# Patient Record
Sex: Male | Born: 1970 | Race: White | Hispanic: No | Marital: Married | Smoking: Former smoker
Health system: Southern US, Community
[De-identification: ages and names within clinical notes are randomized; demographics above are authoritative.]

## PROBLEM LIST (undated history)

## (undated) DIAGNOSIS — E785 Hyperlipidemia, unspecified: Secondary | ICD-10-CM

## (undated) DIAGNOSIS — K219 Gastro-esophageal reflux disease without esophagitis: Secondary | ICD-10-CM

## (undated) HISTORY — PX: UPPER GI ENDOSCOPY: SHX6162

---

## 2012-07-05 ENCOUNTER — Emergency Department (INDEPENDENT_AMBULATORY_CARE_PROVIDER_SITE_OTHER): Payer: BC Managed Care – PPO

## 2012-07-05 ENCOUNTER — Encounter: Payer: Self-pay | Admitting: *Deleted

## 2012-07-05 ENCOUNTER — Emergency Department
Admission: EM | Admit: 2012-07-05 | Discharge: 2012-07-05 | Disposition: A | Discharge status: Home / Self Care | Payer: BC Managed Care – PPO | Source: Home / Self Care | Attending: Family Medicine | Admitting: Family Medicine

## 2012-07-05 DIAGNOSIS — M79609 Pain in unspecified limb: Secondary | ICD-10-CM

## 2012-07-05 DIAGNOSIS — M7989 Other specified soft tissue disorders: Secondary | ICD-10-CM

## 2012-07-05 DIAGNOSIS — W219XXA Striking against or struck by unspecified sports equipment, initial encounter: Secondary | ICD-10-CM

## 2012-07-05 DIAGNOSIS — S63619A Unspecified sprain of unspecified finger, initial encounter: Secondary | ICD-10-CM

## 2012-07-05 DIAGNOSIS — Y9367 Activity, basketball: Secondary | ICD-10-CM

## 2012-07-05 DIAGNOSIS — S6390XA Sprain of unspecified part of unspecified wrist and hand, initial encounter: Secondary | ICD-10-CM

## 2012-07-05 HISTORY — DX: Hyperlipidemia, unspecified: E78.5

## 2012-07-05 HISTORY — DX: Gastro-esophageal reflux disease without esophagitis: K21.9

## 2012-07-05 NOTE — ED Notes (Signed)
Pt c/o RT 5th digit injury x last night while playing basketball. He has taken IBF with some relief.

## 2012-07-05 NOTE — ED Provider Notes (Signed)
History     CSN: [id number]  Arrival date & time 07/05/12  1030   First MD Initiated Contact with Patient 07/05/12 1056      Chief Complaint  Patient presents with  . Finger Injury       HPI Comments: Pt complains RT 5th digit injury x last night while playing basketball. He has taken IBF with some relief.  Patient is a 42 y.o. male presenting with hand pain. The history is provided by the patient.  Hand Pain This is a new problem. The current episode started yesterday. The problem occurs constantly. The problem has not changed since onset.Associated symptoms comments: none. Exacerbated by: flexing finger. The symptoms are relieved by ice. Treatments tried: ice pack. The treatment provided mild relief.    Past Medical History  Diagnosis Date  . GERD (gastroesophageal reflux disease)   . Hyperlipemia     History reviewed. No pertinent past surgical history.  Family History  Problem Relation Age of Onset  . Hyperlipidemia Mother   . Hypertension Mother   . Hyperlipidemia Father     History  Substance Use Topics  . Smoking status: Former Games developer  . Smokeless tobacco: Not on file  . Alcohol Use: Yes      Review of Systems  All other systems reviewed and are negative.    Allergies  Review of patient's allergies indicates no known allergies.  Home Medications   Current Outpatient Rx  Name  Route  Sig  Dispense  Refill  . omeprazole (PRILOSEC) 20 MG capsule   Oral   Take 20 mg by mouth daily.           BP 142/89  Pulse 60  Temp(Src) 98.1 F (36.7 C) (Oral)  Ht 5\' 10"  (1.778 m)  Wt 173 lb (78.472 kg)  BMI 24.82 kg/m2  SpO2 100%  Physical Exam  Nursing note and vitals reviewed. Constitutional: He is oriented to person, place, and time. He appears well-developed and well-nourished. No distress.  HENT:  Head: Atraumatic.  Eyes: Conjunctivae are normal. Pupils are equal, round, and reactive to light.  Musculoskeletal:       Right hand: He exhibits  decreased range of motion, tenderness, bony tenderness and swelling. He exhibits normal two-point discrimination, normal capillary refill, no deformity and no laceration. Normal sensation noted. Normal strength noted.       Hands: Right 5th finger has tenderness over the PIP joint and middle phalanx.  Slight decreased flexion at PIP joint.  Distal neurovascular function is intact.   Neurological: He is alert and oriented to person, place, and time.  Skin: Skin is warm and dry. No erythema.    ED Course  Procedures  none  Labs Reviewed - No data to display Dg Finger Little Right  07/05/2012  *RADIOLOGY REPORT*  Clinical Data: Injured finger playing basketball with pain and swelling  RIGHT LITTLE FINGER 2+V  Comparison: None.  Findings: No acute fracture is seen.  Alignment is normal.  There is soft tissue swelling around the right fifth PIP joint.  IMPRESSION: No acute fracture.   Original Report Authenticated By: Dwyane Dee, M.D.      1. Finger sprain, initial encounter       MDM  Buddy taped fingers. Buddy tape fingers for about 5 days.  Continue applying ice pack several times daily until swelling decreases.  May take Ibuprofen 200mg , 4 tabs every 8 hours with food.  Begin finger exercises in about 3 to 5 days [facility]  information and instruction handout given)  Followup with [facility] if not improving about two weeks.         Lattie Haw, MD 07/09/12 [phone]

## 2013-04-04 ENCOUNTER — Encounter: Payer: Self-pay | Admitting: Emergency Medicine

## 2013-04-04 ENCOUNTER — Emergency Department
Admission: EM | Admit: 2013-04-04 | Discharge: 2013-04-04 | Disposition: A | Discharge status: Home / Self Care | Payer: BC Managed Care – PPO | Source: Home / Self Care | Attending: Family Medicine | Admitting: Family Medicine

## 2013-04-04 ENCOUNTER — Encounter (HOSPITAL_BASED_OUTPATIENT_CLINIC_OR_DEPARTMENT_OTHER): Payer: Self-pay | Admitting: Emergency Medicine

## 2013-04-04 ENCOUNTER — Emergency Department (HOSPITAL_BASED_OUTPATIENT_CLINIC_OR_DEPARTMENT_OTHER)
Admission: EM | Admit: 2013-04-04 | Discharge: 2013-04-04 | Disposition: A | Discharge status: Home / Self Care | Payer: BC Managed Care – PPO | Attending: Emergency Medicine | Admitting: Emergency Medicine

## 2013-04-04 DIAGNOSIS — Z87891 Personal history of nicotine dependence: Secondary | ICD-10-CM | POA: Insufficient documentation

## 2013-04-04 DIAGNOSIS — Z9889 Other specified postprocedural states: Secondary | ICD-10-CM | POA: Insufficient documentation

## 2013-04-04 DIAGNOSIS — Z79899 Other long term (current) drug therapy: Secondary | ICD-10-CM | POA: Insufficient documentation

## 2013-04-04 DIAGNOSIS — K219 Gastro-esophageal reflux disease without esophagitis: Secondary | ICD-10-CM | POA: Insufficient documentation

## 2013-04-04 DIAGNOSIS — Z8639 Personal history of other endocrine, nutritional and metabolic disease: Secondary | ICD-10-CM | POA: Insufficient documentation

## 2013-04-04 DIAGNOSIS — Z862 Personal history of diseases of the blood and blood-forming organs and certain disorders involving the immune mechanism: Secondary | ICD-10-CM | POA: Insufficient documentation

## 2013-04-04 DIAGNOSIS — R110 Nausea: Secondary | ICD-10-CM

## 2013-04-04 LAB — CBC WITH DIFFERENTIAL/PLATELET
BASOS ABS: 0 10*3/uL (ref 0.0–0.1)
BASOS PCT: 0 % (ref 0–1)
EOS ABS: 0 10*3/uL (ref 0.0–0.7)
EOS PCT: 0 % (ref 0–5)
HEMATOCRIT: 42 % (ref 39.0–52.0)
HEMOGLOBIN: 14.2 g/dL (ref 13.0–17.0)
Lymphocytes Relative: 22 % (ref 12–46)
Lymphs Abs: 1.7 10*3/uL (ref 0.7–4.0)
MCH: 29.1 pg (ref 26.0–34.0)
MCHC: 33.8 g/dL (ref 30.0–36.0)
MCV: 86.1 fL (ref 78.0–100.0)
MONO ABS: 0.9 10*3/uL (ref 0.1–1.0)
MONOS PCT: 12 % (ref 3–12)
Neutro Abs: 5.1 10*3/uL (ref 1.7–7.7)
Neutrophils Relative %: 66 % (ref 43–77)
Platelets: 264 10*3/uL (ref 150–400)
RBC: 4.88 MIL/uL (ref 4.22–5.81)
RDW: 13.2 % (ref 11.5–15.5)
WBC: 7.7 10*3/uL (ref 4.0–10.5)

## 2013-04-04 LAB — URINALYSIS, ROUTINE W REFLEX MICROSCOPIC
BILIRUBIN URINE: NEGATIVE
GLUCOSE, UA: NEGATIVE mg/dL
KETONES UR: NEGATIVE mg/dL
Leukocytes, UA: NEGATIVE
Nitrite: NEGATIVE
PH: 6 (ref 5.0–8.0)
PROTEIN: NEGATIVE mg/dL
Specific Gravity, Urine: 1.023 (ref 1.005–1.030)
Urobilinogen, UA: 0.2 mg/dL (ref 0.0–1.0)

## 2013-04-04 LAB — COMPREHENSIVE METABOLIC PANEL
ALBUMIN: 4.6 g/dL (ref 3.5–5.2)
ALT: 28 U/L (ref 0–53)
AST: 27 U/L (ref 0–37)
Alkaline Phosphatase: 61 U/L (ref 39–117)
BILIRUBIN TOTAL: 0.7 mg/dL (ref 0.3–1.2)
BUN: 15 mg/dL (ref 6–23)
CALCIUM: 10 mg/dL (ref 8.4–10.5)
CHLORIDE: 99 meq/L (ref 96–112)
CO2: 27 mEq/L (ref 19–32)
CREATININE: 1.1 mg/dL (ref 0.50–1.35)
GFR calc Af Amer: >90 mL/min (ref >90)
GFR calc non Af Amer: 82 mL/min — ABNORMAL LOW (ref >90)
Glucose, Bld: 106 mg/dL — ABNORMAL HIGH (ref 70–99)
Potassium: 4.1 mEq/L (ref 3.7–5.3)
Sodium: 140 mEq/L (ref 137–147)
TOTAL PROTEIN: 8.6 g/dL — AB (ref 6.0–8.3)

## 2013-04-04 LAB — URINE MICROSCOPIC-ADD ON

## 2013-04-04 LAB — LIPASE, BLOOD: LIPASE: 49 U/L (ref 11–59)

## 2013-04-04 MED ORDER — PROMETHAZINE HCL 25 MG PO TABS: 25.0000 mg | ORAL_TABLET | Freq: Four times a day (QID) | ORAL | Status: AC | PRN

## 2013-04-04 NOTE — ED Notes (Signed)
Pt amb to room 10 with quick steady gait in nad. Pt smiling, reports nausea, one episode daily, x Monday. Pt states he had trouble sleeping last night due to nausea. Denies any emesis or diarrhea.

## 2013-04-04 NOTE — ED Provider Notes (Signed)
CSN: [id number]     Arrival date & time 04/04/13  0907 History   First MD Initiated Contact with Patient 04/04/13 0915     Chief Complaint  Patient presents with  . Nausea  . Abdominal Pain   HPI  Past Medical History  Diagnosis Date  . GERD (gastroesophageal reflux disease)   . Hyperlipemia    Past Surgical History  Procedure Laterality Date  . Upper gi endoscopy     Family History  Problem Relation Age of Onset  . Hyperlipidemia Mother   . Hypertension Mother   . Hyperlipidemia Father    History  Substance Use Topics  . Smoking status: Former Games developermoker  . Smokeless tobacco: Current User  . Alcohol Use: Yes    Review of Systems  Allergies  Review of patient's allergies indicates no known allergies.  Home Medications   BP 163/107  Pulse 75  Temp(Src) 98.3 F (36.8 C) (Oral)  Resp 14  Wt 170 lb (77.111 kg)  SpO2 100% Physical Exam  ED Course  Procedures (including critical care time)   MDM  Pt initially seen with chief complaint of progressive abdominal pain over the last 4-5 days.  Ate some pork tacos 5 days ago.  Has had persistent intermittent abd pain since this point with assd nausea.  No fever, chills.  No diarrhea.  Baseline reflux. Pain sometimes assd after eating.  No am hoarseness, dysphagia.  Pain kept him up all night.  Pain fairly sharp without radiation.  Pain 8+/10 at its worse.  Also pending workup for microscopic hematuria.  No dysuria today.  Because of logistical issues, [facility] as pt may benefit from CT imaging (unavailable here today on holiday).  Discussed case with [facility] EDP.      Doree AlbeeSteven Mykayla Brinton, MD 04/04/13 [phone]

## 2013-04-04 NOTE — ED Provider Notes (Signed)
CSN: [id number]     Arrival date & time 04/04/13  0865 History   First MD Initiated Contact with Patient 04/04/13 1036     Chief Complaint  Patient presents with  . Nausea   (Consider location/radiation/quality/duration/timing/severity/associated sxs/prior Treatment) HPI Comments: Patient is a 43 year old male with history of esophageal reflux. He presents today with complaints of nausea that has been present for the past 6 days. He states he feels a mild "burning" throughout his abdomen but denies any specific pain. He denies having any vomiting or diarrhea and states that his stools have been normal. His concerns are not the severity of his symptoms but the actual duration of the nausea he's been experiencing. He denies any fevers or chills. He denies any urinary complaints. He has had an endoscopy in the past and also tells me he is due for a cystoscopy due to microscopic hematuria that has been found on his urinalysis.  He was seen this morning in urgent care and [facility] and sent here for further evaluation and possible imaging studies.  The history is provided by the patient.    Past Medical History  Diagnosis Date  . GERD (gastroesophageal reflux disease)   . Hyperlipemia    Past Surgical History  Procedure Laterality Date  . Upper gi endoscopy     Family History  Problem Relation Age of Onset  . Hyperlipidemia Mother   . Hypertension Mother   . Hyperlipidemia Father    History  Substance Use Topics  . Smoking status: Former Games developer  . Smokeless tobacco: Current User  . Alcohol Use: Yes    Review of Systems  All other systems reviewed and are negative.    Allergies  Review of patient's allergies indicates no known allergies.  Home Medications   Current Outpatient Rx  Name  Route  Sig  Dispense  Refill  . omeprazole (PRILOSEC) 20 MG capsule   Oral   Take 20 mg by mouth daily.          BP 175/117  Pulse 92  Temp(Src) 98.1 F (36.7 C) (Oral)  Resp 18   Ht 5\' 9"  (1.753 m)  Wt 170 lb (77.111 kg)  BMI 25.09 kg/m2  SpO2 100% Physical Exam  Nursing note and vitals reviewed. Constitutional: He is oriented to person, place, and time. He appears well-developed and well-nourished. No distress.  HENT:  Head: Normocephalic and atraumatic.  Mouth/Throat: Oropharynx is clear and moist.  Neck: Normal range of motion. Neck supple.  Cardiovascular: Normal rate, regular rhythm and normal heart sounds.   No murmur heard. Pulmonary/Chest: Effort normal and breath sounds normal. No respiratory distress. He has no wheezes.  Abdominal: Soft. Bowel sounds are normal. He exhibits no distension and no mass. There is no tenderness. There is no rebound and no guarding.  Musculoskeletal: Normal range of motion. He exhibits no edema.  Lymphadenopathy:    He has no cervical adenopathy.  Neurological: He is alert and oriented to person, place, and time.  Skin: Skin is warm and dry. He is not diaphoretic.    ED Course  Procedures (including critical care time) Labs Review Labs Reviewed - No data to display Imaging Review No results found.  EKG Interpretation   None       MDM  No diagnosis found. Patient is a 43 year old male presents with complaints of nausea that has been ongoing for the past 6 days. He is not concerned about the severity of his illness, more so the duration. He  is not having any abdominal pain, diarrhea, or fever. He is also not having any vomiting. He was seen in urgent care and sent here for further evaluation. Blood work reveals no elevation of white count normal liver functions and lipase. His urine shows microscopic hematuria which is known and is in the process of being worked up. I doubt this is related to today's presentation. I suspect this is a viral illness that will take more time to run its course. I discussed further imaging with the patient as that is why he was sent here. I do not feel as though a CT is warranted as there  is no tenderness, it is not complaining of pain, and his abdomen is completely benign. His laboratory studies are also unremarkable and I feel as though the chances of an abnormality on his CT scan would be slim. We have decided to go with treating the nausea and giving this 2 more days to sort itself out. If his symptoms worsen or do not resolve in that period of time he is to return to discuss a CT scan then.    Andre Lyonsouglas Morris Markham, MD 04/04/13 1200

## 2013-04-04 NOTE — ED Notes (Signed)
Elige Andre Williams reports onset of intermittent nausea and abdominal "burning" x 6 days. This started after having a meal of pork tacos [place] [employer] [place]. Denies vomiting or diarrhea, appetite decreased. Endo done 2 years ago, clear.

## 2013-04-04 NOTE — Discharge Instructions (Signed)
Phenergan as needed for nausea.  Return to the emergency department if not improving in the next 48 hours, sooner if your symptoms worsen or change.   Nausea, Adult Nausea is the feeling that you have an upset stomach or have to vomit. Nausea by itself is not likely a serious concern, but it may be an early sign of more serious medical problems. As nausea gets worse, it can lead to vomiting. If vomiting develops, there is the risk of dehydration.  CAUSES   Viral infections.  Food poisoning.  Medicines.  Pregnancy.  Motion sickness.  Migraine headaches.  Emotional distress.  Severe pain from any source.  Alcohol intoxication. HOME CARE INSTRUCTIONS  Get plenty of rest.  Ask your caregiver about specific rehydration instructions.  Eat small amounts of food and sip liquids more often.  Take all medicines as told by your caregiver. SEEK MEDICAL CARE IF:  You have not improved after 2 days, or you get worse.  You have a headache. SEEK IMMEDIATE MEDICAL CARE IF:   You have a fever.  You faint.  You keep vomiting or have blood in your vomit.  You are extremely weak or dehydrated.  You have dark or bloody stools.  You have severe chest or abdominal pain. MAKE SURE YOU:  Understand these instructions.  Will watch your condition.  Will get help right away if you are not doing well or get worse. Document Released: 04/28/2004 Document Revised: 12/14/2011 Document Reviewed: 12/01/2010 [facility] Patient Information 2014 [place] [place].

## 2013-12-05 ENCOUNTER — Emergency Department
Admission: EM | Admit: 2013-12-05 | Discharge: 2013-12-05 | Disposition: A | Discharge status: Home / Self Care | Payer: BC Managed Care – PPO | Source: Home / Self Care | Attending: Emergency Medicine | Admitting: Emergency Medicine

## 2013-12-05 ENCOUNTER — Emergency Department (INDEPENDENT_AMBULATORY_CARE_PROVIDER_SITE_OTHER): Payer: BC Managed Care – PPO

## 2013-12-05 ENCOUNTER — Encounter: Payer: Self-pay | Admitting: Emergency Medicine

## 2013-12-05 DIAGNOSIS — S62639A Displaced fracture of distal phalanx of unspecified finger, initial encounter for closed fracture: Secondary | ICD-10-CM

## 2013-12-05 DIAGNOSIS — S62634A Displaced fracture of distal phalanx of right ring finger, initial encounter for closed fracture: Secondary | ICD-10-CM

## 2013-12-05 DIAGNOSIS — X58XXXA Exposure to other specified factors, initial encounter: Secondary | ICD-10-CM

## 2013-12-05 NOTE — Discharge Instructions (Signed)
I personally called and spoke with Dr. Melvyn Novas, who is a specialist orthopedics/hand surgeon at [facility] in [place]. Their number is [phone]. Dr. Melvyn Novas will see you tomorrow morning, either at 7 AM or 12 noon.--Please call their number today to confirm this appointment and if any problems with scheduling, contact us at in urgent care.  In the meantime, keep your right fourth finger splinted

## 2013-12-05 NOTE — ED Notes (Signed)
Rt 4th finger injury last night playing basketball, nail is bleeding, can't straighten

## 2013-12-05 NOTE — ED Provider Notes (Signed)
CSN: [id number]     Arrival date & time 12/05/13  0810 History   First MD Initiated Contact with Patient 12/05/13 [phone]     Chief Complaint  Patient presents with  . Finger Injury    HPI Last night, around 10 PM, was playing basketball. When trying to catch a basketball thrown at him, basketball jammed against the tip of the right fourth finger. Since then, pain and swelling right fourth finger tip. He noted that he can't fully extend the distal joint of right fourth finger. Pain is mild to moderate, dull and sharp without radiation. Tylenol has helped the pain somewhat. No other musculoskeletal complaints . Denies numbness or tingling. Past Medical History  Diagnosis Date  . GERD (gastroesophageal reflux disease)   . Hyperlipemia    Past Surgical History  Procedure Laterality Date  . Upper gi endoscopy     Family History  Problem Relation Age of Onset  . Hyperlipidemia Mother   . Hypertension Mother   . Hyperlipidemia Father    History  Substance Use Topics  . Smoking status: Former Games developer  . Smokeless tobacco: Current User  . Alcohol Use: Yes    Review of Systems  All other systems reviewed and are negative.   Allergies  Review of patient's allergies indicates not on file.  Home Medications   Prior to Admission medications   Medication Sig Start Date End Date Taking? Authorizing Provider  atorvastatin (LIPITOR) 20 MG tablet Take 20 mg by mouth daily.   Yes Historical Provider, MD  lisinopril (PRINIVIL,ZESTRIL) 20 MG tablet Take 20 mg by mouth daily.   Yes Historical Provider, MD  omeprazole (PRILOSEC) 20 MG capsule Take 20 mg by mouth daily.    Historical Provider, MD  promethazine (PHENERGAN) 25 MG tablet Take 1 tablet (25 mg total) by mouth every 6 (six) hours as needed for nausea. 04/04/13   Geoffery Lyons, MD   BP 142/91  Pulse 84  Temp(Src) 97.6 F (36.4 C) (Oral)  Ht  (1.778 m)  Wt 176 lb (79.833 kg)  BMI 25.25 kg/m2  SpO2 97% Physical Exam  Nursing  note and vitals reviewed. Constitutional: He is oriented to person, place, and time. He appears well-developed and well-nourished. No distress.  HENT:  Head: Normocephalic and atraumatic.  Eyes: Conjunctivae and EOM are normal. Pupils are equal, round, and reactive to light. No scleral icterus.  Neck: Normal range of motion.  Cardiovascular: Normal rate.   Pulmonary/Chest: Effort normal.  Abdominal: He exhibits no distension.  Musculoskeletal:       Right wrist: Normal.       Right hand: He exhibits decreased range of motion, bony tenderness, deformity (Swelling and hyperflexed right fourth DIP) and swelling. Normal sensation noted. Decreased strength (extension right fourth PIP) noted.       Hands: Right fourth finger: Nail intact, with slight blood around cuticle. No subungual hematoma. Moderate to severe tenderness distal right fourth finger especially from cuticle to DIP. He cannot extend the DIP of right fourth finger.  Otherwise, range of motion and neurovascular intact. No other open wound  Neurological: He is alert and oriented to person, place, and time.  Skin: Skin is warm.  Psychiatric: He has a normal mood and affect.    ED Course  Procedures (including critical care time) Labs Review Labs Reviewed - No data to display  Imaging Review Dg Finger Ring Right  12/05/2013   CLINICAL DATA:  Jammed finger.  EXAM: RIGHT RING FINGER 2+V  COMPARISON:  None.  FINDINGS: Fracture noted from the of posterior aspect of the base of the distal phalanx of the right fourth digit. Fracture fragment is displaced. Fracture extends into the joint space. No radiopaque foreign bodies .  IMPRESSION: 1. Displaced avulsion fracture from the posterior aspect of the base of the distal phalanx of the right fourth digit. 2. No foreign body.   Electronically Signed   By: Maisie Fus  Register   On: 12/05/2013 08:41     MDM   1. Displaced fracture of distal phalanx of right ring finger, closed, initial  encounter    He likely has avulsion injury of the distal extensor tendon of right fourth DIP. Treatment options discussed, as well as risks, benefits, alternatives. Patient voiced understanding and agreement with the following plans:  I personally called and spoke with Dr. Melvyn Novas, who is a specialist orthopedics/hand surgeon at [facility] in [place]. He advised:  Dr. Melvyn Novas will see pt tomorrow morning, either at 7 AM or 12 noon.--Please call their number [phone]. today to confirm this appointment and if any problems with scheduling, contact us at in urgent care.  In the meantime, as directed by Dr. Melvyn Novas, I fashioned volar and palmar aluminum splint 0 flexion/extension (straight) . Advised to keep your right fourth finger splinted. An After Visit Summary and xray report was printed and given to the patient.  He declined prescription pain medication, and he prefers to use ibuprofen up to 800 mg 3 times a day with food as needed for pain. Precautions discussed. Red flags discussed. Questions invited and answered. Patient voiced understanding and agreement.      Lajean Manes, MD 12/05/13 1000

## 2021-01-12 ENCOUNTER — Emergency Department
Admission: RE | Admit: 2021-01-12 | Discharge: 2021-01-12 | Disposition: A | Discharge status: Home / Self Care | Payer: Self-pay | Source: Ambulatory Visit

## 2021-01-12 ENCOUNTER — Other Ambulatory Visit: Payer: Self-pay

## 2021-01-12 VITALS — BP 145/101 | HR 80 | Temp 97.8°F | Resp 16

## 2021-01-12 DIAGNOSIS — R058 Other specified cough: Secondary | ICD-10-CM

## 2021-01-12 MED ORDER — DM-GUAIFENESIN ER 30-600 MG PO TB12: 1.0000 | ORAL_TABLET | Freq: Two times a day (BID) | ORAL | 0 refills | Status: AC

## 2021-01-12 MED ORDER — PREDNISONE 20 MG PO TABS: 20.0000 mg | ORAL_TABLET | Freq: Two times a day (BID) | ORAL | 0 refills | Status: AC

## 2021-01-12 MED ORDER — BENZONATATE 200 MG PO CAPS: 200.0000 mg | ORAL_CAPSULE | Freq: Two times a day (BID) | ORAL | 0 refills | Status: AC | PRN

## 2021-01-12 NOTE — Discharge Instructions (Addendum)
To reduce inflammation in the lungs take the prednisone twice a day for 5 days For cough symptoms take Mucinex 1 pill 2 times a day and Tussionex 1 pill 2 times a day Drink lots of fluids Call or return for problems

## 2021-01-12 NOTE — ED Triage Notes (Signed)
Patient presents to Urgent Care with complaints of cough since 5-7 days ago. Patient reports hoarseness. Having a little runny nose. Having dry cough, worst at night, unable to sleep at night. Has not tried anything for the cough. Denies any fever or chills. His wife having similar symptoms.
